# Patient Record
Sex: Female | Born: 1970 | Race: White | Hispanic: Yes | Marital: Married
Health system: Southern US, Community
[De-identification: ages and names within clinical notes are randomized; demographics above are authoritative.]

## PROBLEM LIST (undated history)

## (undated) DIAGNOSIS — R7303 Prediabetes: Secondary | ICD-10-CM

## (undated) HISTORY — PX: HAND SURGERY: SHX662

---

## 2006-10-24 ENCOUNTER — Ambulatory Visit: Payer: Self-pay | Admitting: Family Medicine

## 2007-04-23 ENCOUNTER — Inpatient Hospital Stay: Payer: Self-pay

## 2019-01-12 ENCOUNTER — Other Ambulatory Visit: Payer: Self-pay

## 2019-01-12 DIAGNOSIS — Z20822 Contact with and (suspected) exposure to covid-19: Secondary | ICD-10-CM

## 2019-01-15 LAB — NOVEL CORONAVIRUS, NAA: SARS-CoV-2, NAA: NOT DETECTED

## 2019-07-26 ENCOUNTER — Ambulatory Visit: Payer: Self-pay | Attending: Internal Medicine

## 2019-07-26 DIAGNOSIS — Z23 Encounter for immunization: Secondary | ICD-10-CM | POA: Insufficient documentation

## 2019-07-26 NOTE — Progress Notes (Signed)
   Covid-19 Vaccination Clinic  Name:  Amy Arroyo    MRN: [id number] DOB: Jan 04, 1971  07/26/2019  Ms. Amy Arroyo was observed post Covid-19 immunization for 15 minutes without incidence. She was provided with Vaccine Information Sheet and instruction to access the V-Safe system.   Ms. Amy Arroyo was instructed to call 911 with any severe reactions post vaccine: Marland Kitchen Difficulty breathing  . Swelling of your face and throat  . A fast heartbeat  . A bad rash all over your body  . Dizziness and weakness    Immunizations Administered    Name Date Dose VIS Date Route   Pfizer COVID-19 Vaccine 07/26/2019 10:22 AM 0.3 mL 05/29/2019 Intramuscular   Manufacturer: [employer], [employer]   Lot: [id number]   NDC: [address]-8

## 2019-08-19 ENCOUNTER — Ambulatory Visit: Payer: Self-pay | Attending: Internal Medicine

## 2019-08-19 DIAGNOSIS — Z23 Encounter for immunization: Secondary | ICD-10-CM | POA: Insufficient documentation

## 2019-08-19 NOTE — Progress Notes (Signed)
   Covid-19 Vaccination Clinic  Name:  Amy Arroyo    MRN: [id number] DOB: 1971/02/05  08/19/2019  Amy Arroyo was observed post Covid-19 immunization for 15 minutes without incident. She was provided with Vaccine Information Sheet and instruction to access the V-Safe system.   Amy Arroyo was instructed to call 911 with any severe reactions post vaccine: Marland Kitchen Difficulty breathing  . Swelling of face and throat  . A fast heartbeat  . A bad rash all over body  . Dizziness and weakness   Immunizations Administered    Name Date Dose VIS Date Route   Pfizer COVID-19 Vaccine 08/19/2019  8:28 AM 0.3 mL 05/29/2019 Intramuscular   Manufacturer: [employer], [employer]   Lot: [id number]   NDC: [address]-1

## 2019-12-07 ENCOUNTER — Ambulatory Visit
Admission: RE | Admit: 2019-12-07 | Discharge: 2019-12-07 | Disposition: A | Discharge status: Home / Self Care | Payer: PRIVATE HEALTH INSURANCE | Source: Ambulatory Visit | Attending: Family Medicine | Admitting: Family Medicine

## 2019-12-07 ENCOUNTER — Other Ambulatory Visit: Payer: Self-pay | Admitting: Family Medicine

## 2019-12-07 DIAGNOSIS — M25571 Pain in right ankle and joints of right foot: Secondary | ICD-10-CM

## 2019-12-07 DIAGNOSIS — M25471 Effusion, right ankle: Secondary | ICD-10-CM | POA: Diagnosis present

## 2020-05-02 ENCOUNTER — Other Ambulatory Visit: Payer: Self-pay | Admitting: Family Medicine

## 2020-05-02 DIAGNOSIS — Z1231 Encounter for screening mammogram for malignant neoplasm of breast: Secondary | ICD-10-CM

## 2020-07-28 IMAGING — CR DG ANKLE 2V *R*
2 series · 2 of 2 positions shown · non-contrast
Comparison: None.

CLINICAL DATA: Right pain and swelling for 2 weeks, initial
encounter

EXAM:
RIGHT ANKLE - 2 VIEW

[ankle ap]
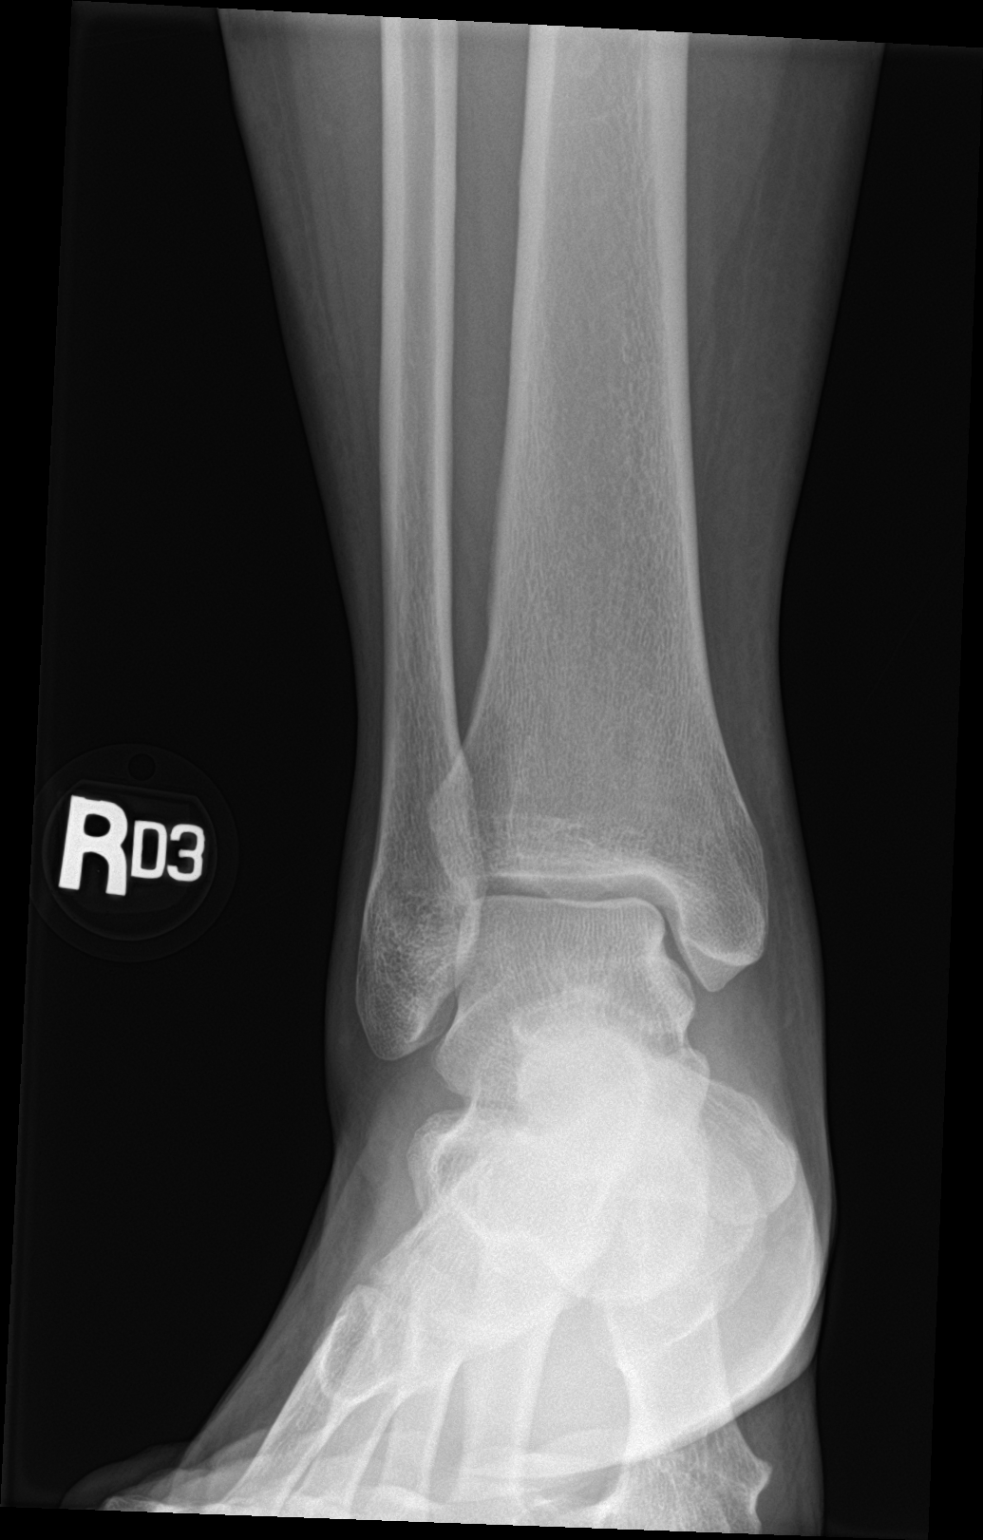

[ankle lat]
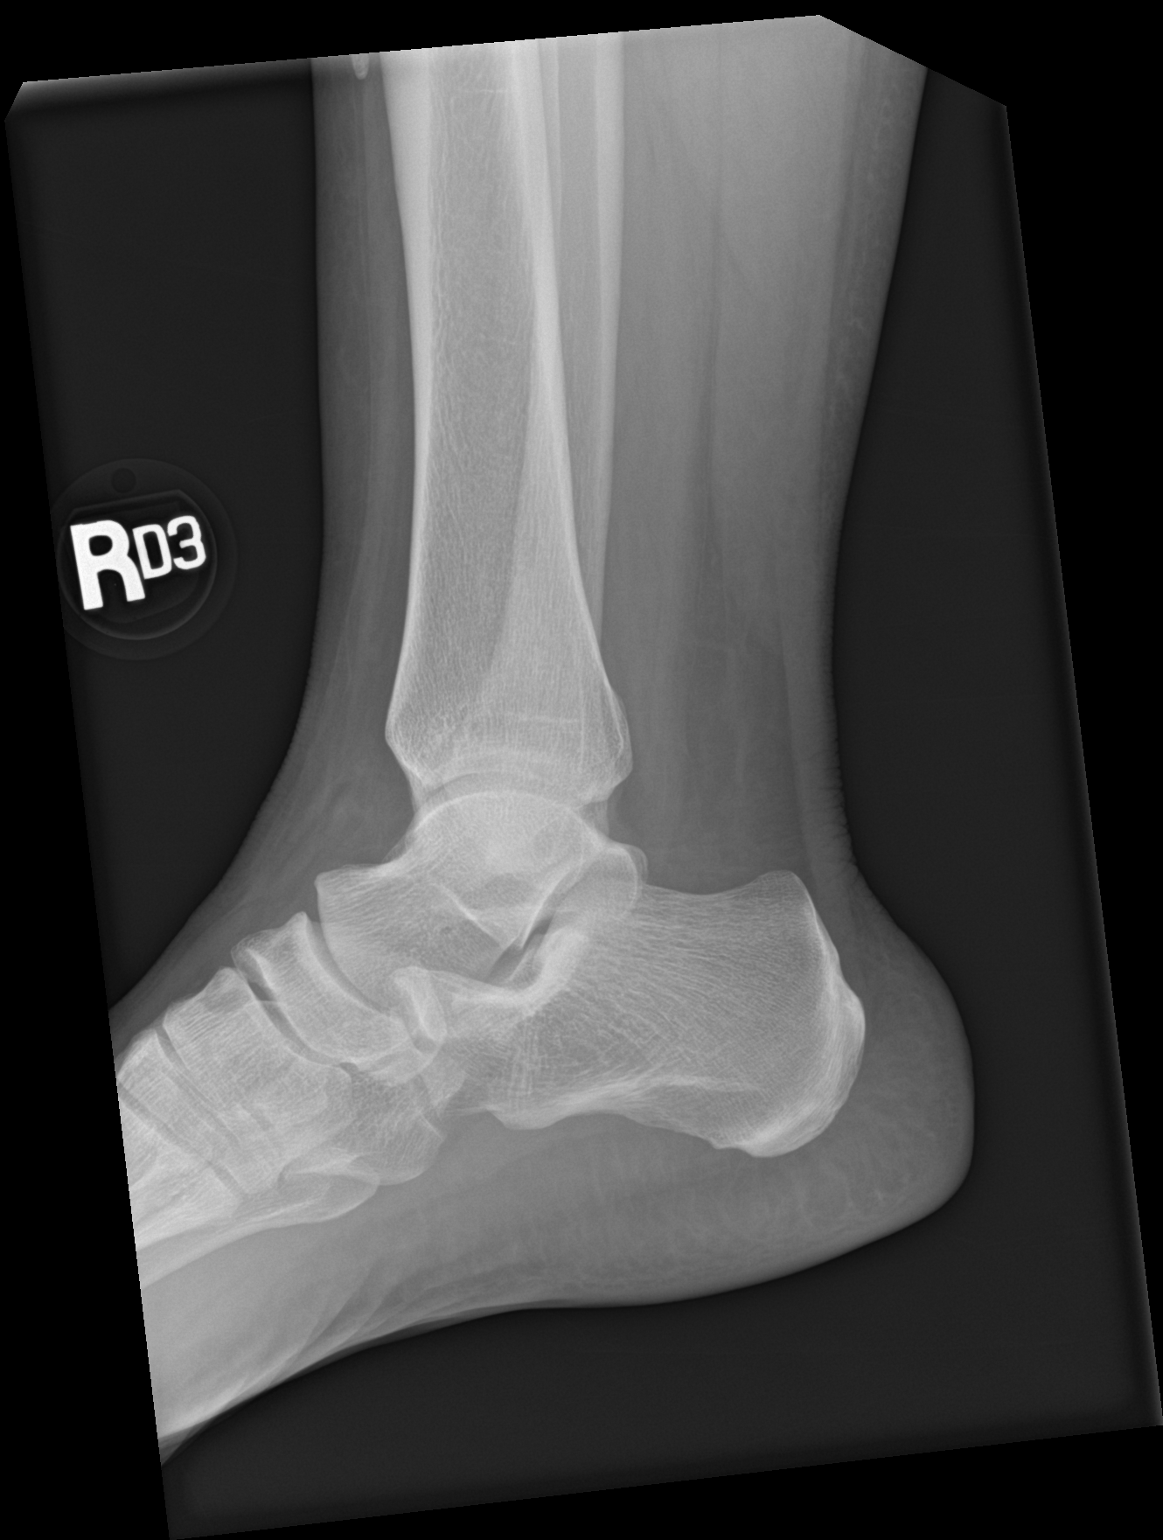

[2 of 2 positions shown; findings below may reference images not displayed]

FINDINGS: There is no evidence of fracture, dislocation, or joint effusion.
There is no evidence of arthropathy or other focal bone abnormality.
Soft tissues are unremarkable.
IMPRESSION: No acute abnormality noted.

## 2022-04-30 ENCOUNTER — Other Ambulatory Visit: Payer: Self-pay | Admitting: Family Medicine

## 2022-04-30 DIAGNOSIS — Z1231 Encounter for screening mammogram for malignant neoplasm of breast: Secondary | ICD-10-CM

## 2022-05-02 ENCOUNTER — Encounter: Payer: Self-pay | Admitting: Family Medicine

## 2022-05-16 ENCOUNTER — Other Ambulatory Visit: Payer: Self-pay

## 2022-05-16 ENCOUNTER — Ambulatory Visit (INDEPENDENT_AMBULATORY_CARE_PROVIDER_SITE_OTHER): Payer: BC Managed Care – PPO | Admitting: Dermatology

## 2022-05-16 DIAGNOSIS — R21 Rash and other nonspecific skin eruption: Secondary | ICD-10-CM

## 2022-05-16 MED ORDER — LEVOCETIRIZINE DIHYDROCHLORIDE 5 MG PO TABS: 5.0000 mg | ORAL_TABLET | Freq: Every evening | ORAL | 5 refills | Status: AC

## 2022-05-16 NOTE — Patient Instructions (Addendum)
Urticaria or hives is a pink to red patchy whelp- like rash of the skin that typically itches and it is the result of histamine release in the skin.   Hives may have multiple causes including stress, medications, infections, and systemic illness.  Sometimes there is a family history of chronic urticaria.   "Physical urticarias" may be caused by heat, sun, cold, vibration.   Insect bites can cause "papular urticaria". It is often difficult to find the cause of generalized hives.  Statistically, 70% of the time a cause of generalized hives is not found.  Sometimes hives can spontaneously resolve. Other times hives can persist and when it does, and no cause is found, and it has been at least 6 weeks since started, it is called "chronic idiopathic urticaria". Antihistamines are the mainstay for treatment.  In severe cases Xolair injections may be used.  Start Xyzal take 1 tablet every morning. Start Benadryl take 1 tablet every evening.  May use triamcinolone 0.1% ointment once to twice daily as needed for itch. Avoid face, groin, underarms.  Topical steroids (such as triamcinolone, fluocinolone, fluocinonide, mometasone, clobetasol, halobetasol, betamethasone, hydrocortisone) can cause thinning and lightening of the skin if they are used for too long in the same area. Your physician has selected the right strength medicine for your problem and area affected on the body. Please use your medication only as directed by your physician to prevent side effects.   Gentle Skin Care Guide  1. Bathe no more than once a day.  2. Avoid bathing in hot water  3. Use a mild soap like Dove, Vanicream, Cetaphil, CeraVe. Can use Lever 2000 or Cetaphil antibacterial soap  4. Use soap only where you need it. On most days, use it under your arms, between your legs, and on your feet. Let the water rinse other areas unless visibly dirty.  5. When you get out of the bath/shower, use a towel to gently blot your skin dry,  don't rub it.  6. While your skin is still a little damp, apply a moisturizing cream such as Vanicream, CeraVe, Cetaphil, Eucerin, Sarna lotion or plain Vaseline Jelly. For hands apply Neutrogena [place] Hand Cream or Excipial Hand Cream.  7. Reapply moisturizer any time you start to itch or feel dry.  8. Sometimes using free and clear laundry detergents can be helpful. Fabric softener sheets should be avoided. Downy Free & Gentle liquid, or any liquid fabric softener that is free of dyes and perfumes, it acceptable to use  9. If your doctor has given you prescription creams you may apply moisturizers over them     Due to recent changes in healthcare laws, you may see results of your pathology and/or laboratory studies on MyChart before the doctors have had a chance to review them. We understand that in some cases there may be results that are confusing or concerning to you. Please understand that not all results are received at the same time and often the doctors may need to interpret multiple results in order to provide you with the best plan of care or course of treatment. Therefore, we ask that you please give [place] 2 business days to thoroughly review all your results before contacting the office for clarification. Should we see a critical lab result, you will be contacted sooner.   If You Need Anything After Your Visit  If you have any questions or concerns for your doctor, please call our main line at [phone] and press option 4 to  reach your doctor's medical assistant. If no one answers, please leave a voicemail as directed and we will return your call as soon as possible. Messages left after 4 pm will be answered the following business day.   You may also send [place] a message via MyChart. We typically respond to MyChart messages within 1-2 business days.  For prescription refills, please ask your pharmacy to contact our office. Our fax number is [phone].  If you have an urgent  issue when the clinic is closed that cannot wait until the next business day, you can page your doctor at the number below.    Please note that while we do our best to be available for urgent issues outside of office hours, we are not available 24/7.   If you have an urgent issue and are unable to reach [place], you may choose to seek medical care at your doctor's office, retail clinic, urgent care center, or emergency room.  If you have a medical emergency, please immediately call 911 or go to the emergency department.  Pager Numbers  - Dr. Gwen Pounds: [phone]  - Dr. Neale Burly: [phone]  - Dr. Roseanne Reno: [phone]  In the event of inclement weather, please call our main line at [phone] for an update on the status of any delays or closures.  Dermatology Medication Tips: Please keep the boxes that topical medications come in in order to help keep track of the instructions about where and how to use these. Pharmacies typically print the medication instructions only on the boxes and not directly on the medication tubes.   If your medication is too expensive, please contact our office at [phone] option 4 or send [place] a message through MyChart.   We are unable to tell what your co-pay for medications will be in advance as this is different depending on your insurance coverage. However, we may be able to find a substitute medication at lower cost or fill out paperwork to get insurance to cover a needed medication.   If a prior authorization is required to get your medication covered by your insurance company, please allow [place] 1-2 business days to complete this process.  Drug prices often vary depending on where the prescription is filled and some pharmacies may offer cheaper prices.  The website www.goodrx.com contains coupons for medications through different pharmacies. The prices here do not account for what the cost may be with help from insurance (it may be cheaper with your  insurance), but the website can give you the price if you did not use any insurance.  - You can print the associated coupon and take it with your prescription to the pharmacy.  - You may also stop by our office during regular business hours and pick up a GoodRx coupon card.  - If you need your prescription sent electronically to a different pharmacy, notify our office through [facility] or by phone at [phone] option 4.     Si Usted Necesita Algo Despus de Su Visita  Tambin puede enviarnos un mensaje a travs de Clinical cytogeneticist. Por lo general respondemos a los mensajes de MyChart en el transcurso de 1 a 2 das hbiles.  Para renovar recetas, por favor pida a su farmacia que se ponga en contacto con nuestra oficina. Annie Sable de fax es [place] [phone].  Si tiene un asunto urgente cuando la clnica est cerrada y que no puede esperar hasta el siguiente da hbil, puede llamar/localizar a su doctor(a) al nmero que aparece a continuacin.  Por favor, tenga en cuenta que aunque hacemos todo lo posible para estar disponibles para asuntos urgentes fuera del horario de [place], no estamos disponibles las 24 horas del da, los 7 [address]  [address] de la [place].   Si tiene un problema urgente y no puede comunicarse con nosotros, puede optar por buscar atencin mdica  en el consultorio de su doctor(a), en una clnica privada, en un centro de atencin urgente o en una sala de emergencias.  Si tiene Engineer, drilling, por favor llame inmediatamente al 911 o vaya a la sala de emergencias.  Nmeros de bper  - Dr. Gwen Pounds: [phone]  - Dra. Moye: [phone]  - Dra. Roseanne Reno: [phone]  En caso de inclemencias del [place], por favor llame a Lacy Duverney principal al [phone] para una actualizacin sobre el [place] de cualquier retraso o cierre.  Consejos para la medicacin en dermatologa: Por favor, guarde las cajas en las que vienen los medicamentos de uso tpico para ayudarle a  seguir las instrucciones sobre dnde y cmo usarlos. Las farmacias generalmente imprimen las instrucciones del medicamento slo en las cajas y no directamente en los tubos del [place].   Si su medicamento es muy caro, por favor, pngase en contacto con Rolm Gala llamando al [phone] y presione la opcin 4 o envenos un mensaje a travs de Clinical cytogeneticist.   No podemos decirle cul ser su copago por los medicamentos por adelantado ya que esto es diferente dependiendo de la cobertura de su seguro. Sin embargo, es posible que podamos encontrar un medicamento sustituto a Audiological scientist un formulario para que el seguro cubra el medicamento que se considera necesario.   Si se requiere una autorizacin previa para que su compaa de seguros [place] su medicamento, por favor permtanos de 1 a 2 das hbiles para completar [address]  Los precios de los medicamentos varan con frecuencia dependiendo del Environmental consultant de dnde se surte la receta y alguna farmacias pueden ofrecer precios ms baratos.  El sitio web www.goodrx.com tiene cupones para medicamentos de Health and safety inspector. Los precios aqu no tienen en cuenta lo que podra costar con la ayuda del seguro (puede ser ms barato con su seguro), pero el sitio web puede darle el precio si no utiliz Tourist information centre manager.  - Puede imprimir el cupn correspondiente y llevarlo con su receta a la farmacia.  - Tambin puede pasar por nuestra oficina durante el horario de atencin regular y Education officer, museum una tarjeta de cupones de GoodRx.  - Si necesita que su receta se enve electrnicamente a una farmacia diferente, informe a nuestra oficina a travs de MyChart de [place] o por telfono llamando al [phone] y presione la opcin 4.

## 2022-05-16 NOTE — Progress Notes (Signed)
   New Patient Visit  Subjective  Amy Arroyo is a 51 y.o. female who presents for the following: Rash.  Chest and neck. Rash has flared several times this year, only when it's warmer weather and when she is outside. Rash is itchy and burns and takes about 2-3 weeks to clear. Gets worse when scratched. She has seen her regular doctor and is prescribed Prednisone, which helps. She has also tried TMC 0.1% ointment and Ammonium Lactate 12%. She takes Zyrtec for itching. She does have seasonal allergies.  No history of asthma. Patient had a reaction when she ate pizza recently, rash on face. Rash lasted for about 30 minutes. It happened twice after eating pizza. No mouth/throat swelling. No recent illness, no fevers, no thyroid problems. Patient otherwise healthy.  The following portions of the chart were reviewed this encounter and updated as appropriate:       Review of Systems:  No other skin or systemic complaints except as noted in HPI or Assessment and Plan.  Objective  Well appearing patient in no apparent distress; mood and affect are within normal limits.  A focused examination was performed including face, chest, neck, arms, back. Relevant physical exam findings are noted in the Assessment and Plan.  chest Mild erythema on the chest today. Photo patient has on phone shows edematous pink papules and plaques on the neck, chest. Positive dermatographism today.    Assessment & Plan  Rash chest  Possible Urticaria by history and photo, rash not present today. History of facial rash after eating pizza, no throat swelling.  When rash recurs, start Xyzal 5 MG take 1 po QAM dsp #30  And start Benadryl 1 po QHS May continue TMC 0.1% ointment QD/BID prn itch. Avoid face, groin, axilla. Pt has.  Topical steroids (such as triamcinolone, fluocinolone, fluocinonide, mometasone, clobetasol, halobetasol, betamethasone, hydrocortisone) can cause thinning and lightening of the skin if  they are used for too long in the same area. Your physician has selected the right strength medicine for your problem and area affected on the body. Please use your medication only as directed by your physician to prevent side effects.   Will refer patient to allergist for possible food allergy.  Allergy.   Pt to RTC for evaluation when rash present  Recommend mild soap and moisturizing cream 1-2 times daily.  Gentle skin care handout provided.    levocetirizine (XYZAL) 5 MG tablet - chest Take 1 tablet (5 mg total) by mouth every evening.   Return if symptoms worsen or fail to improve.  ICherlyn Labella, CMA, am acting as scribe for Willeen Niece, MD .  Documentation: I have reviewed the above documentation for accuracy and completeness, and I agree with the above.  Willeen Niece MD

## 2022-05-17 ENCOUNTER — Encounter: Payer: Self-pay | Admitting: Dermatology

## 2022-05-17 ENCOUNTER — Ambulatory Visit (INDEPENDENT_AMBULATORY_CARE_PROVIDER_SITE_OTHER): Payer: BC Managed Care – PPO | Admitting: Dermatology

## 2022-05-17 DIAGNOSIS — R21 Rash and other nonspecific skin eruption: Secondary | ICD-10-CM

## 2022-05-17 MED ORDER — DOXYCYCLINE MONOHYDRATE 100 MG PO CAPS: 100.0000 mg | ORAL_CAPSULE | Freq: Two times a day (BID) | ORAL | 0 refills | Status: AC

## 2022-05-17 MED ORDER — MUPIROCIN 2 % EX OINT: 1.0000 | TOPICAL_OINTMENT | Freq: Every day | CUTANEOUS | 0 refills | Status: AC

## 2022-05-17 NOTE — Patient Instructions (Signed)
Start mupirocin ointment to affect areas and cover with band aid. Start doxycycline 100 mg twice daily with food x 7.  Continue Xyzal increasing to 1 pill twice daily. May increase to 2 pills twice daily if needed.   Doxycycline should be taken with food to prevent nausea. Do not lay down for 30 minutes after taking. Be cautious with sun exposure and use good sun protection while on this medication. Pregnant women should not take this medication.   Due to recent changes in healthcare laws, you may see results of your pathology and/or laboratory studies on MyChart before the doctors have had a chance to review them. We understand that in some cases there may be results that are confusing or concerning to you. Please understand that not all results are received at the same time and often the doctors may need to interpret multiple results in order to provide you with the best plan of care or course of treatment. Therefore, we ask that you please give [place] 2 business days to thoroughly review all your results before contacting the office for clarification. Should we see a critical lab result, you will be contacted sooner.   If You Need Anything After Your Visit  If you have any questions or concerns for your doctor, please call our main line at [phone] and press option 4 to reach your doctor's medical assistant. If no one answers, please leave a voicemail as directed and we will return your call as soon as possible. Messages left after 4 pm will be answered the following business day.   You may also send [place] a message via MyChart. We typically respond to MyChart messages within 1-2 business days.  For prescription refills, please ask your pharmacy to contact our office. Our fax number is [phone].  If you have an urgent issue when the clinic is closed that cannot wait until the next business day, you can page your doctor at the number below.    Please note that while we do our best to be  available for urgent issues outside of office hours, we are not available 24/7.   If you have an urgent issue and are unable to reach [place], you may choose to seek medical care at your doctor's office, retail clinic, urgent care center, or emergency room.  If you have a medical emergency, please immediately call 911 or go to the emergency department.  Pager Numbers  - Dr. Gwen Pounds: [phone]  - Dr. Neale Burly: [phone]  - Dr. Roseanne Reno: [phone]  In the event of inclement weather, please call our main line at [phone] for an update on the status of any delays or closures.  Dermatology Medication Tips: Please keep the boxes that topical medications come in in order to help keep track of the instructions about where and how to use these. Pharmacies typically print the medication instructions only on the boxes and not directly on the medication tubes.   If your medication is too expensive, please contact our office at [phone] option 4 or send [place] a message through MyChart.   We are unable to tell what your co-pay for medications will be in advance as this is different depending on your insurance coverage. However, we may be able to find a substitute medication at lower cost or fill out paperwork to get insurance to cover a needed medication.   If a prior authorization is required to get your medication covered by your insurance company, please allow [place] 1-2 business days to complete  this process.  Drug prices often vary depending on where the prescription is filled and some pharmacies may offer cheaper prices.  The website www.goodrx.com contains coupons for medications through different pharmacies. The prices here do not account for what the cost may be with help from insurance (it may be cheaper with your insurance), but the website can give you the price if you did not use any insurance.  - You can print the associated coupon and take it with your prescription to the pharmacy.  -  You may also stop by our office during regular business hours and pick up a GoodRx coupon card.  - If you need your prescription sent electronically to a different pharmacy, notify our office through [facility] or by phone at [phone] option 4.     Si Usted Necesita Algo Despus de Su Visita  Tambin puede enviarnos un mensaje a travs de Clinical cytogeneticist. Por lo general respondemos a los mensajes de MyChart en el transcurso de 1 a 2 das hbiles.  Para renovar recetas, por favor pida a su farmacia que se ponga en contacto con nuestra oficina. Annie Sable de fax es [place] [phone].  Si tiene un asunto urgente cuando la clnica est cerrada y que no puede esperar hasta el siguiente da hbil, puede llamar/localizar a su doctor(a) al nmero que aparece a continuacin.   Por favor, tenga en cuenta que aunque hacemos todo lo posible para estar disponibles para asuntos urgentes fuera del horario de [place], no estamos disponibles las 24 horas del da, los 7 [address]  [address] de la [place].   Si tiene un problema urgente y no puede comunicarse con nosotros, puede optar por buscar atencin mdica  en el consultorio de su doctor(a), en una clnica privada, en un centro de atencin urgente o en una sala de emergencias.  Si tiene Engineer, drilling, por favor llame inmediatamente al 911 o vaya a la sala de emergencias.  Nmeros de bper  - Dr. Gwen Pounds: [phone]  - Dra. Moye: [phone]  - Dra. Roseanne Reno: [phone]  En caso de inclemencias del [place], por favor llame a Lacy Duverney principal al [phone] para una actualizacin sobre el [place] de cualquier retraso o cierre.  Consejos para la medicacin en dermatologa: Por favor, guarde las cajas en las que vienen los medicamentos de uso tpico para ayudarle a seguir las instrucciones sobre dnde y cmo usarlos. Las farmacias generalmente imprimen las instrucciones del medicamento slo en las cajas y no directamente en los tubos del  [place].   Si su medicamento es muy caro, por favor, pngase en contacto con Rolm Gala llamando al [phone] y presione la opcin 4 o envenos un mensaje a travs de Clinical cytogeneticist.   No podemos decirle cul ser su copago por los medicamentos por adelantado ya que esto es diferente dependiendo de la cobertura de su seguro. Sin embargo, es posible que podamos encontrar un medicamento sustituto a Audiological scientist un formulario para que el seguro cubra el medicamento que se considera necesario.   Si se requiere una autorizacin previa para que su compaa de seguros [place] su medicamento, por favor permtanos de 1 a 2 das hbiles para completar [address]  Los precios de los medicamentos varan con frecuencia dependiendo del Environmental consultant de dnde se surte la receta y alguna farmacias pueden ofrecer precios ms baratos.  El sitio web www.goodrx.com tiene cupones para medicamentos de Health and safety inspector. Los precios aqu no tienen en cuenta lo que podra costar con la ayuda del seguro (  puede ser ms barato con su seguro), pero el sitio web puede darle el precio si no utiliz ningn seguro.  - Puede imprimir el cupn correspondiente y llevarlo con su receta a la farmacia.  - Tambin puede pasar por nuestra oficina durante el horario de atencin regular y recoger una tarjeta de cupones de GoodRx.  - Si necesita que su receta se enve electrnicamente a una farmacia diferente, informe a nuestra oficina a travs de MyChart de [place] o por telfono llamando al [phone] y presione la opcin 4.  

## 2022-05-17 NOTE — Progress Notes (Signed)
   Follow-Up Visit   Subjective  Amy Arroyo is a 51 y.o. female who presents for the following: Rash (Patient was seen yesterday for history of a rash at chest. Patient was told to take Xyzal, Benadryl and use TMC 0.1% ointment when rash recurs. She is back here today for a few spots that are now itching and weeping that started this am. She notes these are different than any rashes she has had before).  Patient advises the spots burn and itch.   The following portions of the chart were reviewed this encounter and updated as appropriate:   Allergies  Meds  Problems  Med Hx  Surg Hx  Fam Hx      Review of Systems:  No other skin or systemic complaints except as noted in HPI or Assessment and Plan.  Objective  Well appearing patient in no apparent distress; mood and affect are within normal limits.  A focused examination was performed including arms, legs, chest. Relevant physical exam findings are noted in the Assessment and Plan.  Right Upper Arm Tender edematous erythematous plaques approximately 6-7 cm wide with central erosion and weeping at left ankle, 2 at right leg and 1 at upper arm    Assessment & Plan  Rash Right Upper Arm  Pt reports this is different than rashes she has had previous. She reports burning at sites. The areas appeared overnight.   Favor bacterial infection based on exam and history.  Start mupirocin ointment to affect areas and cover with band aid. Start doxycycline 100 mg twice daily with food x 7.  Continue Xyzal increasing to 1 pill twice daily. May increase to 2 pills twice daily if needed for itch.  Pt advised to seek medical care for fevers, chills, spreading redness. Pt advised to reach out if this is not better by early next week.  Doxycycline should be taken with food to prevent nausea. Do not lay down for 30 minutes after taking. Be cautious with sun exposure and use good sun protection while on this medication. Pregnant women  should not take this medication.     doxycycline (MONODOX) 100 MG capsule - Right Upper Arm Take 1 capsule (100 mg total) by mouth 2 (two) times daily for 7 days. Take with food  mupirocin ointment (BACTROBAN) 2 % - Right Upper Arm Apply 1 Application topically daily. And cover with band aid  Related Procedures Anaerobic and Aerobic Culture  Related Medications levocetirizine (XYZAL) 5 MG tablet Take 1 tablet (5 mg total) by mouth every evening.   Return if symptoms worsen or fail to improve.  Anise Salvo, RMA, am acting as scribe for Darden Dates, MD .  Documentation: I have reviewed the above documentation for accuracy and completeness, and I agree with the above.  Darden Dates, MD

## 2022-05-23 ENCOUNTER — Ambulatory Visit (INDEPENDENT_AMBULATORY_CARE_PROVIDER_SITE_OTHER): Payer: BC Managed Care – PPO | Admitting: Dermatology

## 2022-05-23 ENCOUNTER — Encounter: Payer: Self-pay | Admitting: Dermatology

## 2022-05-23 DIAGNOSIS — R21 Rash and other nonspecific skin eruption: Secondary | ICD-10-CM

## 2022-05-23 MED ORDER — CLOBETASOL PROPIONATE 0.05 % EX OINT: TOPICAL_OINTMENT | CUTANEOUS | 1 refills | Status: AC

## 2022-05-23 NOTE — Patient Instructions (Addendum)
For Rash at legs  Start clobetasol ointment twice daily to affected areas of rash use for up to 2 weeks. Do not apply to open areas. Avoid face/groin/ body folds  Use mupirocin ointment apply to any open areas and cover until healed.   Discontinue doxycycline     Topical steroids (such as triamcinolone, fluocinolone, fluocinonide, mometasone, clobetasol, halobetasol, betamethasone, hydrocortisone) can cause thinning and lightening of the skin if they are used for too long in the same area. Your physician has selected the right strength medicine for your problem and area affected on the body. Please use your medication only as directed by your physician to prevent side effects.    Due to recent changes in healthcare laws, you may see results of your pathology and/or laboratory studies on MyChart before the doctors have had a chance to review them. We understand that in some cases there may be results that are confusing or concerning to you. Please understand that not all results are received at the same time and often the doctors may need to interpret multiple results in order to provide you with the best plan of care or course of treatment. Therefore, we ask that you please give [place] 2 business days to thoroughly review all your results before contacting the office for clarification. Should we see a critical lab result, you will be contacted sooner.   If You Need Anything After Your Visit  If you have any questions or concerns for your doctor, please call our main line at [phone] and press option 4 to reach your doctor's medical assistant. If no one answers, please leave a voicemail as directed and we will return your call as soon as possible. Messages left after 4 pm will be answered the following business day.   You may also send [place] a message via MyChart. We typically respond to MyChart messages within 1-2 business days.  For prescription refills, please ask your pharmacy to contact our  office. Our fax number is [phone].  If you have an urgent issue when the clinic is closed that cannot wait until the next business day, you can page your doctor at the number below.    Please note that while we do our best to be available for urgent issues outside of office hours, we are not available 24/7.   If you have an urgent issue and are unable to reach [place], you may choose to seek medical care at your doctor's office, retail clinic, urgent care center, or emergency room.  If you have a medical emergency, please immediately call 911 or go to the emergency department.  Pager Numbers  - Dr. Gwen Pounds: [phone]  - Dr. Neale Burly: [phone]  - Dr. Roseanne Reno: [phone]  In the event of inclement weather, please call our main line at [phone] for an update on the status of any delays or closures.  Dermatology Medication Tips: Please keep the boxes that topical medications come in in order to help keep track of the instructions about where and how to use these. Pharmacies typically print the medication instructions only on the boxes and not directly on the medication tubes.   If your medication is too expensive, please contact our office at [phone] option 4 or send [place] a message through MyChart.   We are unable to tell what your co-pay for medications will be in advance as this is different depending on your insurance coverage. However, we may be able to find a substitute medication at lower cost or fill  out paperwork to get insurance to cover a needed medication.   If a prior authorization is required to get your medication covered by your insurance company, please allow [place] 1-2 business days to complete this process.  Drug prices often vary depending on where the prescription is filled and some pharmacies may offer cheaper prices.  The website www.goodrx.com contains coupons for medications through different pharmacies. The prices here do not account for what the cost may  be with help from insurance (it may be cheaper with your insurance), but the website can give you the price if you did not use any insurance.  - You can print the associated coupon and take it with your prescription to the pharmacy.  - You may also stop by our office during regular business hours and pick up a GoodRx coupon card.  - If you need your prescription sent electronically to a different pharmacy, notify our office through [facility] or by phone at [phone] option 4.     Si Usted Necesita Algo Despus de Su Visita  Tambin puede enviarnos un mensaje a travs de Pharmacist, community. Por lo general respondemos a los mensajes de MyChart en el transcurso de 1 a 2 das hbiles.  Para renovar recetas, por favor pida a su farmacia que se ponga en contacto con nuestra oficina. Harland Dingwall de fax es [place] [phone].  Si tiene un asunto urgente cuando la clnica est cerrada y que no puede esperar hasta el siguiente da hbil, puede llamar/localizar a su doctor(a) al nmero que aparece a continuacin.   Por favor, tenga en cuenta que aunque hacemos todo lo posible para estar disponibles para asuntos urgentes fuera del horario de [place], no estamos disponibles las 24 horas del da, los 7 das de la [place].   Si tiene un problema urgente y no puede comunicarse con nosotros, puede optar por buscar atencin mdica  en el consultorio de su doctor(a), en una clnica privada, en un centro de atencin urgente o en una sala de emergencias.  Si tiene Engineering geologist, por favor llame inmediatamente al 911 o vaya a la sala de emergencias.  Nmeros de bper  - Dr. Nehemiah Massed: [phone]  - Dra. Moye: [phone]  - Dra. Nicole Kindred: [phone]  En caso de inclemencias del [place], por favor llame a Johnsie Kindred principal al [phone] para una actualizacin sobre el [place] de cualquier retraso o cierre.  Consejos para la medicacin en dermatologa: Por favor, guarde las cajas en las  que vienen los medicamentos de uso tpico para ayudarle a seguir las instrucciones sobre dnde y cmo usarlos. Las farmacias generalmente imprimen las instrucciones del medicamento slo en las cajas y no directamente en los tubos del [place].   Si su medicamento es muy caro, por favor, pngase en contacto con Zigmund Daniel llamando al [phone] y presione la opcin 4 o envenos un mensaje a travs de Pharmacist, community.   No podemos decirle cul ser su copago por los medicamentos por adelantado ya que esto es diferente dependiendo de la cobertura de su seguro. Sin embargo, es posible que podamos encontrar un medicamento sustituto a Electrical engineer un formulario para que el seguro cubra el medicamento que se considera necesario.   Si se requiere una autorizacin previa para que su compaa de seguros [place] su medicamento, por favor permtanos de 1 a 2 das hbiles para completar este proceso.  Los precios de los medicamentos varan con frecuencia dependiendo del Environmental consultant de dnde se surte la receta y  alguna farmacias pueden ofrecer precios ms baratos.  El sitio web www.goodrx.com tiene cupones para medicamentos de Airline pilot. Los precios aqu no tienen en cuenta lo que podra costar con la ayuda del seguro (puede ser ms barato con su seguro), pero el sitio web puede darle el precio si no utiliz Research scientist (physical sciences).  - Puede imprimir el cupn correspondiente y llevarlo con su receta a la farmacia.  - Tambin puede pasar por nuestra oficina durante el horario de atencin regular y Charity fundraiser una tarjeta de cupones de GoodRx.  - Si necesita que su receta se enve electrnicamente a una farmacia diferente, informe a nuestra oficina a travs de MyChart de [place] o por telfono llamando al [phone] y presione la opcin 4.

## 2022-05-23 NOTE — Progress Notes (Signed)
   Follow-Up Visit   Subjective  Amy Arroyo is a 51 y.o. female who presents for the following: Rash (Patient here today with itchy rash follow up. Reports rash has spread b/l lower legs. Patient was prescribed doxycycline, mupirocin and xyzal to use. Reports itching a lot and can not sleep. ).  Clobetasol ointment bid aa skip open areas for 2 weeks.   The following portions of the chart were reviewed this encounter and updated as appropriate:  Allergies  Meds  Problems  Med Hx  Surg Hx  Fam Hx      Review of Systems: No other skin or systemic complaints except as noted in HPI or Assessment and Plan.   Objective  Well appearing patient in no apparent distress; mood and affect are within normal limits.  A focused examination was performed including legs, arms. Relevant physical exam findings are noted in the Assessment and Plan.  b/l lower legs Edematous small erythematous papule centrally located within erythematous thin circular plaque at right leg. Also at right and left legs are central small erosions within erythematous patches   Assessment & Plan  Rash and other nonspecific skin eruption b/l lower legs  Exam very suggestive of bite reaction today and not suspicious for infection based on additional lesions and evolution of lesions from last week.   Start clobetasol ointment bid to aa areas of rash use for up to 2 weeks. Do not apply to open areas. Avoid face/groin/ body folds  Continue to use mupirocin ointment to any open areas apply daily  Recommend avoiding exposure to insects, using insect repellent. Call if not clearing up.  Topical steroids (such as triamcinolone, fluocinolone, fluocinonide, mometasone, clobetasol, halobetasol, betamethasone, hydrocortisone) can cause thinning and lightening of the skin if they are used for too long in the same area. Your physician has selected the right strength medicine for your problem and area affected on the body.  Please use your medication only as directed by your physician to prevent side effects.    clobetasol ointment (TEMOVATE) 0.05 % - b/l lower legs Apply bid to aa of rash for up to 2 weeks. Do not apply to open areas. Avoid applying to face, groin, and axilla. Use as directed. Long-term use can cause thinning of the skin.   Return for 1-3 month rash follow up.  I, Asher Muir, CMA, am acting as scribe for Darden Dates, MD.  Documentation: I have reviewed the above documentation for accuracy and completeness, and I agree with the above.  Darden Dates, MD

## 2022-05-25 LAB — ANAEROBIC AND AEROBIC CULTURE

## 2022-05-28 ENCOUNTER — Telehealth: Payer: Self-pay

## 2022-05-28 NOTE — Telephone Encounter (Signed)
-----   Message from Virginia Moye, MD sent at 05/28/2022 11:32 AM EST ----- Culture showed bacteria that usually causes acne. As long as she is doing well, no additional antibiotic treatment needed. Please also check how she is doing with the clobetasol prescribed last week. Thank you!  MAs please call. Thank you! 

## 2022-05-29 ENCOUNTER — Telehealth: Payer: Self-pay

## 2022-05-29 NOTE — Telephone Encounter (Signed)
Patient advised culture showed bacteria that normally causes acne, no further treatment needed. Patient advises she is improved since using clobetasol. Butch Penny., RMA

## 2022-05-29 NOTE — Telephone Encounter (Signed)
-----   Message from Sandi Mealy, MD sent at 05/28/2022 11:32 AM EST ----- Culture showed bacteria that usually causes acne. As long as she is doing well, no additional antibiotic treatment needed. Please also check how she is doing with the clobetasol prescribed last week. Thank you!  MAs please call. Thank you!

## 2022-06-01 ENCOUNTER — Ambulatory Visit
Admission: RE | Admit: 2022-06-01 | Discharge: 2022-06-01 | Disposition: A | Discharge status: Home / Self Care | Payer: BC Managed Care – PPO | Source: Ambulatory Visit | Attending: Family Medicine | Admitting: Family Medicine

## 2022-06-01 ENCOUNTER — Encounter: Payer: Self-pay | Admitting: Radiology

## 2022-06-01 DIAGNOSIS — Z1231 Encounter for screening mammogram for malignant neoplasm of breast: Secondary | ICD-10-CM

## 2022-06-04 ENCOUNTER — Inpatient Hospital Stay
Admission: RE | Admit: 2022-06-04 | Discharge: 2022-06-04 | Disposition: A | Discharge status: Home / Self Care | Payer: Self-pay | Source: Ambulatory Visit | Attending: *Deleted | Admitting: *Deleted

## 2022-06-04 ENCOUNTER — Other Ambulatory Visit: Payer: Self-pay | Admitting: *Deleted

## 2022-06-04 DIAGNOSIS — Z1231 Encounter for screening mammogram for malignant neoplasm of breast: Secondary | ICD-10-CM

## 2022-07-12 ENCOUNTER — Telehealth: Payer: Self-pay

## 2022-07-12 DIAGNOSIS — Z1211 Encounter for screening for malignant neoplasm of colon: Secondary | ICD-10-CM

## 2022-07-12 NOTE — Telephone Encounter (Signed)
Gastroenterology Pre-Procedure Review Patient has requested a call back next week to schedule date.  Request Date: TBD Requesting Physician: Dr. Dellie Catholic  PATIENT REVIEW QUESTIONS: The patient responded to the following health history questions as indicated:    1. Are you having any GI issues? no 2. Do you have a personal history of Polyps? no 3. Do you have a family history of Colon Cancer or Polyps? no 4. Diabetes Mellitus? no 5. Joint replacements in the past 12 months?no 6. Major health problems in the past 3 months?no 7. Any artificial heart valves, MVP, or defibrillator?no    MEDICATIONS & ALLERGIES:    Patient reports the following regarding taking any anticoagulation/antiplatelet therapy:   Plavix, Coumadin, Eliquis, Xarelto, Lovenox, Pradaxa, Brilinta, or Effient? no Aspirin? no  Patient confirms/reports the following medications:  Current Outpatient Medications  Medication Sig Dispense Refill   clobetasol ointment (TEMOVATE) 0.05 % Apply bid to aa of rash for up to 2 weeks. Do not apply to open areas. Avoid applying to face, groin, and axilla. Use as directed. Long-term use can cause thinning of the skin. 60 g 1   levocetirizine (XYZAL) 5 MG tablet Take 1 tablet (5 mg total) by mouth every evening. 30 tablet 5   mupirocin ointment (BACTROBAN) 2 % Apply 1 Application topically daily. And cover with band aid 22 g 0   No current facility-administered medications for this visit.    Patient confirms/reports the following allergies:  Allergies  Allergen Reactions   Sulfa Antibiotics     No orders of the defined types were placed in this encounter.   AUTHORIZATION INFORMATION Primary Insurance: 1D#: Group #:  Secondary Insurance: 1D#: Group #:  SCHEDULE INFORMATION: Date: TBD Time: Location: TBD

## 2022-07-19 ENCOUNTER — Encounter: Payer: Self-pay | Admitting: Family Medicine

## 2022-08-02 ENCOUNTER — Ambulatory Visit: Payer: PRIVATE HEALTH INSURANCE | Admitting: Dermatology

## 2022-08-19 ENCOUNTER — Emergency Department
Admission: EM | Admit: 2022-08-19 | Discharge: 2022-08-19 | Disposition: A | Discharge status: Home / Self Care | Payer: BC Managed Care – PPO | Attending: Emergency Medicine | Admitting: Emergency Medicine

## 2022-08-19 ENCOUNTER — Other Ambulatory Visit: Payer: Self-pay

## 2022-08-19 DIAGNOSIS — L03114 Cellulitis of left upper limb: Secondary | ICD-10-CM | POA: Diagnosis not present

## 2022-08-19 DIAGNOSIS — M7989 Other specified soft tissue disorders: Secondary | ICD-10-CM | POA: Diagnosis present

## 2022-08-19 LAB — CBC WITH DIFFERENTIAL/PLATELET
Abs Immature Granulocytes: 0.09 10*3/uL — ABNORMAL HIGH (ref 0.00–0.07)
Basophils Absolute: 0 10*3/uL (ref 0.0–0.1)
Basophils Relative: 0 %
Eosinophils Absolute: 0.5 10*3/uL (ref 0.0–0.5)
Eosinophils Relative: 5 %
HCT: 46.3 % — ABNORMAL HIGH (ref 36.0–46.0)
Hemoglobin: 15.3 g/dL — ABNORMAL HIGH (ref 12.0–15.0)
Immature Granulocytes: 1 %
Lymphocytes Relative: 24 %
Lymphs Abs: 2.6 10*3/uL (ref 0.7–4.0)
MCH: 30.4 pg (ref 26.0–34.0)
MCHC: 33 g/dL (ref 30.0–36.0)
MCV: 91.9 fL (ref 80.0–100.0)
Monocytes Absolute: 0.6 10*3/uL (ref 0.1–1.0)
Monocytes Relative: 5 %
Neutro Abs: 7 10*3/uL (ref 1.7–7.7)
Neutrophils Relative %: 65 %
Platelets: 283 10*3/uL (ref 150–400)
RBC: 5.04 MIL/uL (ref 3.87–5.11)
RDW: 12.9 % (ref 11.5–15.5)
WBC: 10.8 10*3/uL — ABNORMAL HIGH (ref 4.0–10.5)
nRBC: 0 % (ref 0.0–0.2)

## 2022-08-19 LAB — BASIC METABOLIC PANEL
Anion gap: 8 (ref 5–15)
BUN: 14 mg/dL (ref 6–20)
CO2: 26 mmol/L (ref 22–32)
Calcium: 9.2 mg/dL (ref 8.9–10.3)
Chloride: 101 mmol/L (ref 98–111)
Creatinine, Ser: 0.74 mg/dL (ref 0.44–1.00)
GFR, Estimated: >60 mL/min (ref >60)
Glucose, Bld: 109 mg/dL — ABNORMAL HIGH (ref 70–99)
Potassium: 3.9 mmol/L (ref 3.5–5.1)
Sodium: 135 mmol/L (ref 135–145)

## 2022-08-19 MED ORDER — DOXYCYCLINE MONOHYDRATE 100 MG PO TABS: 100.0000 mg | ORAL_TABLET | Freq: Two times a day (BID) | ORAL | 0 refills | Status: AC

## 2022-08-19 MED ORDER — SODIUM CHLORIDE 0.9 % IV SOLN
2.0000 g | INTRAVENOUS | Status: DC
  Administered 2022-08-19: 2 g via INTRAVENOUS
  Filled 2022-08-19: qty 20

## 2022-08-19 MED ORDER — CEPHALEXIN 500 MG PO CAPS: 500.0000 mg | ORAL_CAPSULE | Freq: Four times a day (QID) | ORAL | 0 refills | Status: AC

## 2022-08-19 NOTE — Discharge Instructions (Signed)
Keep your arm elevated.  Please follow-up with your outpatient provider for wound recheck in 2 days.  Continue the antibiotics as prescribed.  Please return for any new, worsening, or change in symptoms or other concerns.  It was a pleasure caring for you today.

## 2022-08-19 NOTE — ED Notes (Signed)
Pt unsure what may have bit her. Has 2 possible bite marks to L hand. Whole L hand is swollen and has red streaking going up L arm to elbow area. She first noticed the streaking last night. Another new area of streaking has been present since this morning. Denies significant pain.

## 2022-08-19 NOTE — ED Triage Notes (Signed)
Pt reports yesterday started with itching to her left arm. Pt reports started with 2 small bumps on her hand, unsure but thinks she may have been bit by something. Pt reports itching became worse and now has redness and itching up her left arm.

## 2022-08-19 NOTE — ED Provider Notes (Signed)
[facility] Provider Note    Event Date/Time   First MD Initiated Contact with Patient 08/19/22 [phone]     (approximate)   History   Rash   HPI  Amy Arroyo is a 52 y.o. female who presents today for evaluation of left arm erythema.  Patient reports that yesterday she was bit by an insect on 2 places on the dorsum of her hand between her thumb and index finger, and she was scratching it quite a bit.  Today she noticed redness extending from these bite areas with a red streak to her elbow.  No fevers or chills.  No pain.  She is able to move all of her fingers and wrist normally.  She is unsure what bit her.  There are no problems to display for this patient.         Physical Exam   Triage Vital Signs: ED Triage Vitals  Enc Vitals Group     BP 08/19/22 0924 (!) 159/105     Pulse Rate 08/19/22 0924 84     Resp 08/19/22 0924 20     Temp 08/19/22 0924 98.4 F (36.9 C)     Temp Source 08/19/22 0924 Oral     SpO2 08/19/22 0924 100 %     Weight 08/19/22 0923 200 lb (90.7 kg)     Height 08/19/22 0923 '5\' 2"'$  (1.575 m)     Head Circumference --      Peak Flow --      Pain Score 08/19/22 0923 3     Pain Loc --      Pain Edu? --      Excl. in [place]? --     Most recent vital signs: Vitals:   08/19/22 0924 08/19/22 1116  BP: (!) 159/105 (!) 158/103  Pulse: 84 84  Resp: 20 16  Temp: 98.4 F (36.9 C) 98.6 F (37 C)  SpO2: 100% 100%    Physical Exam Vitals and nursing note reviewed.  Constitutional:      General: Awake and alert. No acute distress.    Appearance: Normal appearance. The patient is normal weight.  HENT:     Head: Normocephalic and atraumatic.     Mouth: Mucous membranes are moist.  Eyes:     General: PERRL. Normal EOMs        Right eye: No discharge.        Left eye: No discharge.     Conjunctiva/sclera: Conjunctivae normal.  Cardiovascular:     Rate and Rhythm: Normal rate and regular rhythm.     Pulses: Normal  pulses.  Pulmonary:     Effort: Pulmonary effort is normal. No respiratory distress.     Breath sounds: Normal breath sounds.  Abdominal:     Abdomen is soft. There is no abdominal tenderness. No rebound or guarding. No distention. Musculoskeletal:        General: No swelling. Normal range of motion.     Cervical back: Normal range of motion and neck supple.  Left upper extremity: Area of erythema and swelling to the webspace between thumb and index finger, with erythema extending proximally to the wrist.  Lymphangitis noted to the [facility] fossa.  She is able to abduct and adduct thumb fully.  She has no tenderness to palpation.  Normal grip strength.  Able to range wrist normally.  Compartments are soft compressible throughout.  No palmar abnormalities.  No pain with passive extension of fingers or wrist Skin:  General: Skin is warm and dry.     Capillary Refill: Capillary refill takes less than 2 seconds.     Findings: No rash.  Neurological:     Mental Status: The patient is awake and alert.      ED Results / Procedures / Treatments   Labs (all labs ordered are listed, but only abnormal results are displayed) Labs Reviewed  BASIC METABOLIC PANEL - Abnormal; Notable for the following components:      Result Value   Glucose, Bld 109 (*)    All other components within normal limits  CBC WITH DIFFERENTIAL/PLATELET - Abnormal; Notable for the following components:   WBC 10.8 (*)    Hemoglobin 15.3 (*)    HCT 46.3 (*)    Abs Immature Granulocytes 0.09 (*)    All other components within normal limits     EKG     RADIOLOGY     PROCEDURES:  Critical Care performed:   Procedures   MEDICATIONS ORDERED IN ED: Medications  cefTRIAXone (ROCEPHIN) 2 g in sodium chloride 0.9 % 100 mL IVPB (0 g Intravenous Stopped 08/19/22 1030)     IMPRESSION / MDM / ASSESSMENT AND PLAN / ED COURSE  I reviewed the triage vital signs and the nursing notes.   Differential diagnosis  includes, but is not limited to, cellulitis, abscess, allergic reaction, lymphangitis.  Patient is awake and alert, hemodynamically stable and afebrile.  She has obvious area of erythema and swelling to the dorsum of her hand with lymphangitis to her AC fossa.  Symptoms most consistent with cellulitis, especially in the setting of recent bite and scratching the area.  She has no pain to palpation.  She has no fevers or chills to suggest SIRS/sepsis.  Her compartments are soft compressible throughout with normal distal pulses and sensation, no pain out of proportion, no pain with passive extension of fingers or wrist, not consistent with compartment syndrome.  No focal area of fluctuance to suggest abscess.  She has full normal range of motion of all digits in her hand and wrist, no pain with axial loading, no joint effusion, do not suspect septic wrist.  No tenderness along the flexor tendon sheath to suggest flexor tenosynovitis.  Physical exam is consistent with cellulitis. No crepitus or bullae or hemodynamic instability or pain out of proportion to indicate deep space infection. No fluctuance to suggest cystic lesion such as abscess. No fevers or constitutional symptoms to suggest systemic infection.  Physical exam does not show any evidence of joint involvement. Labs are overall reassuring. Patient will initiate oral antibiotics and follow-up closely as an outpatient. Return if worsening or developing new symptoms in which case may require IV antibiotics. The area was outlined with skin marking pen. Discussed care plan, return precautions, and advised close outpatient follow-up. Patient agrees with plan of care.  Patient was discussed with Dr. Jacelyn Grip who agrees with assessment and plan.  Patient's presentation is most consistent with acute complicated illness / injury requiring diagnostic workup.    FINAL CLINICAL IMPRESSION(S) / ED DIAGNOSES   Final diagnoses:  Cellulitis of left upper extremity      Rx / DC Orders   ED Discharge Orders          Ordered    cephALEXin (KEFLEX) 500 MG capsule  4 times daily        08/19/22 1113    doxycycline (ADOXA) 100 MG tablet  2 times daily        08/19/22 1113  Note:  This document was prepared using Dragon voice recognition software and may include unintentional dictation errors.   Emeline Gins 08/19/22 1350    Lucillie Garfinkel, MD 08/19/22 [phone]

## 2023-04-08 ENCOUNTER — Other Ambulatory Visit: Payer: Self-pay | Admitting: Family Medicine

## 2023-04-08 DIAGNOSIS — R2241 Localized swelling, mass and lump, right lower limb: Secondary | ICD-10-CM

## 2023-04-09 ENCOUNTER — Ambulatory Visit
Admission: RE | Admit: 2023-04-09 | Discharge: 2023-04-09 | Disposition: A | Discharge status: Home / Self Care | Payer: BC Managed Care – PPO | Source: Ambulatory Visit | Attending: Family Medicine | Admitting: Family Medicine

## 2023-04-09 ENCOUNTER — Other Ambulatory Visit: Payer: Self-pay | Admitting: Family Medicine

## 2023-04-09 ENCOUNTER — Ambulatory Visit
Admission: RE | Admit: 2023-04-09 | Discharge: 2023-04-09 | Disposition: A | Discharge status: Home / Self Care | Payer: BC Managed Care – PPO | Source: Ambulatory Visit | Attending: Family Medicine | Admitting: *Deleted

## 2023-04-09 DIAGNOSIS — M25571 Pain in right ankle and joints of right foot: Secondary | ICD-10-CM | POA: Diagnosis present

## 2023-04-09 DIAGNOSIS — R2241 Localized swelling, mass and lump, right lower limb: Secondary | ICD-10-CM | POA: Diagnosis present

## 2023-04-09 DIAGNOSIS — M25561 Pain in right knee: Secondary | ICD-10-CM | POA: Diagnosis present

## 2023-04-10 ENCOUNTER — Other Ambulatory Visit: Payer: Self-pay

## 2023-04-10 DIAGNOSIS — Z1211 Encounter for screening for malignant neoplasm of colon: Secondary | ICD-10-CM

## 2023-04-16 ENCOUNTER — Other Ambulatory Visit: Payer: Self-pay

## 2023-04-16 MED ORDER — NA SULFATE-K SULFATE-MG SULF 17.5-3.13-1.6 GM/177ML PO SOLN: 354.0000 mL | Freq: Once | ORAL | 0 refills | Status: AC

## 2023-04-17 ENCOUNTER — Encounter: Payer: Self-pay | Admitting: Gastroenterology

## 2023-04-24 ENCOUNTER — Encounter
Admission: RE | Disposition: A | Discharge status: Home / Self Care | Payer: Self-pay | Source: Home / Self Care | Attending: Gastroenterology

## 2023-04-24 ENCOUNTER — Ambulatory Visit: Payer: BC Managed Care – PPO | Admitting: Anesthesiology

## 2023-04-24 ENCOUNTER — Ambulatory Visit
Admission: RE | Admit: 2023-04-24 | Discharge: 2023-04-24 | Disposition: A | Discharge status: Home / Self Care | Payer: BC Managed Care – PPO | Attending: Gastroenterology | Admitting: Gastroenterology

## 2023-04-24 DIAGNOSIS — Z1211 Encounter for screening for malignant neoplasm of colon: Secondary | ICD-10-CM | POA: Diagnosis present

## 2023-04-24 DIAGNOSIS — Z800 Family history of malignant neoplasm of digestive organs: Secondary | ICD-10-CM | POA: Diagnosis not present

## 2023-04-24 DIAGNOSIS — D126 Benign neoplasm of colon, unspecified: Secondary | ICD-10-CM

## 2023-04-24 DIAGNOSIS — D125 Benign neoplasm of sigmoid colon: Secondary | ICD-10-CM | POA: Diagnosis not present

## 2023-04-24 DIAGNOSIS — D120 Benign neoplasm of cecum: Secondary | ICD-10-CM | POA: Insufficient documentation

## 2023-04-24 HISTORY — DX: Prediabetes: R73.03

## 2023-04-24 HISTORY — PX: HOT HEMOSTASIS: SHX5433

## 2023-04-24 HISTORY — PX: HEMOSTASIS CLIP PLACEMENT: SHX6857

## 2023-04-24 HISTORY — PX: COLONOSCOPY WITH PROPOFOL: SHX5780

## 2023-04-24 HISTORY — PX: POLYPECTOMY: SHX5525

## 2023-04-24 LAB — POCT PREGNANCY, URINE: Preg Test, Ur: NEGATIVE

## 2023-04-24 SURGERY — COLONOSCOPY WITH PROPOFOL
Anesthesia: General

## 2023-04-24 MED ORDER — PROPOFOL 500 MG/50ML IV EMUL
INTRAVENOUS | Status: DC | PRN
  Administered 2023-04-24: 150 ug/kg/min via INTRAVENOUS

## 2023-04-24 MED ORDER — LIDOCAINE HCL (CARDIAC) PF 100 MG/5ML IV SOSY
PREFILLED_SYRINGE | INTRAVENOUS | Status: DC | PRN
  Administered 2023-04-24: 100 mg via INTRAVENOUS

## 2023-04-24 MED ORDER — PROPOFOL 10 MG/ML IV BOLUS
INTRAVENOUS | Status: DC | PRN
  Administered 2023-04-24: 80 mg via INTRAVENOUS

## 2023-04-24 MED ORDER — SODIUM CHLORIDE 0.9 % IV SOLN: INTRAVENOUS | Status: DC

## 2023-04-24 NOTE — H&P (Signed)
     Wyline Mood, MD [address], [address], [place], [place], [address] [address], [address], [place], [place], [address] Phone: [phone]  Fax: [phone]  Primary Care Physician:  Hillery Aldo, MD   Pre-Procedure History & Physical: HPI:  Amy Arroyo is a 52 y.o. female is here for an colonoscopy.   Past Medical History:  Diagnosis Date   Pre-diabetes     Past Surgical History:  Procedure Laterality Date   HAND SURGERY Left    left thumb    Prior to Admission medications   Medication Sig Start Date End Date Taking? Authorizing Provider  clobetasol ointment (TEMOVATE) 0.05 % Apply bid to aa of rash for up to 2 weeks. Do not apply to open areas. Avoid applying to face, groin, and axilla. Use as directed. Long-term use can cause thinning of the skin. Patient not taking: Reported on 07/12/2022 05/23/22   Neale Burly, [place], MD  levocetirizine (XYZAL) 5 MG tablet Take 1 tablet (5 mg total) by mouth every evening. 05/16/22   Willeen Niece, MD  mupirocin ointment (BACTROBAN) 2 % Apply 1 Application topically daily. And cover with band aid Patient not taking: Reported on 07/12/2022 05/17/22   Sandi Mealy, MD    Allergies as of 04/10/2023 - Review Complete 08/19/2022  Allergen Reaction Noted   Sulfa antibiotics  05/16/2022    History reviewed. No pertinent family history.  Social History   Socioeconomic History   Marital status: Married    Spouse name: Not on file   Number of children: Not on file   Years of education: Not on file   Highest education level: Not on file  Occupational History   Not on file  Tobacco Use   Smoking status: Never   Smokeless tobacco: Never  Vaping Use   Vaping status: Never Used  Substance and Sexual Activity   Alcohol use: Never   Drug use: Never   Sexual activity: Not on file  Other Topics Concern   Not on file  Social History Narrative   Not on file   Social Determinants of Health   Financial Resource Strain:  Not on file  Food Insecurity: Not on file  Transportation Needs: Not on file  Physical Activity: Not on file  Stress: Not on file  Social Connections: Not on file  Intimate Partner Violence: Not on file    Review of Systems: See HPI, otherwise negative ROS  Physical Exam: There were no vitals taken for this visit. General:   Alert,  pleasant and cooperative in NAD Head:  Normocephalic and atraumatic. Neck:  Supple; no masses or thyromegaly. Lungs:  Clear throughout to auscultation, normal respiratory effort.    Heart:  +S1, +S2, Regular rate and rhythm, No edema. Abdomen:  Soft, nontender and nondistended. Normal bowel sounds, without guarding, and without rebound.   Neurologic:  Alert and  oriented x4;  grossly normal neurologically.  Impression/Plan: Amy Arroyo is here for an colonoscopy to be performed for Screening colonoscopy average risk   Risks, benefits, limitations, and alternatives regarding  colonoscopy have been reviewed with the patient.  Questions have been answered.  All parties agreeable.   Wyline Mood, MD  04/24/2023, 9:26 AM

## 2023-04-24 NOTE — Op Note (Signed)
[facility] Gastroenterology Patient Name: Amy Arroyo Procedure Date: 04/24/2023 10:38 AM MRN: [id number] Account #: [id number] Date of Birth: [birth year] Admit Type: Outpatient Age: 52 Room: [facility] ENDO ROOM 3 Gender: Female Note Status: Finalized Instrument Name: Prentice Docker [id number] Procedure:             Colonoscopy Indications:           Screening in patient at increased risk: Colorectal                         cancer in brother before age 45 Providers:             Wyline Mood MD, MD Referring MD:          Hillery Aldo MD, MD (Referring MD) Medicines:             Monitored Anesthesia Care Complications:         No immediate complications. Procedure:             Pre-Anesthesia Assessment:                        - Prior to the procedure, a History and Physical was                         performed, and patient medications, allergies and                         sensitivities were reviewed. The patient's tolerance                         of previous anesthesia was reviewed.                        - The risks and benefits of the procedure and the                         sedation options and risks were discussed with the                         patient. All questions were answered and informed                         consent was obtained.                        - ASA Grade Assessment: II - A patient with mild                         systemic disease.                        After obtaining informed consent, the colonoscope was                         passed under direct vision. Throughout the procedure,                         the patient's blood pressure, pulse, and oxygen  saturations were monitored continuously. The                         Colonoscope was introduced through the anus and                         advanced to the the cecum, identified by the                         appendiceal orifice. The colonoscopy was performed                          with ease. The patient tolerated the procedure well.                         The quality of the bowel preparation was excellent.                         The ileocecal valve, appendiceal orifice, and rectum                         were photographed. Findings:      The perianal and digital rectal examinations were normal.      A 7 mm polyp was found in the cecum. The polyp was sessile. The polyp       was removed with a cold snare. Resection and retrieval were complete.      A 15 mm polyp was found in the sigmoid colon. The polyp was       pedunculated. The polyp was removed with a hot snare. Resection and       retrieval were complete. To prevent bleeding post-intervention, one       hemostatic clip was successfully placed. Clip manufacturer: [employer]. There was no bleeding at the end of the procedure.      The exam was otherwise without abnormality on direct and retroflexion       views. Impression:            - One 7 mm polyp in the cecum, removed with a cold                         snare. Resected and retrieved.                        - One 15 mm polyp in the sigmoid colon, removed with a                         hot snare. Resected and retrieved. Clip was placed.                         Clip manufacturer: [employer].                        - The examination was otherwise normal on direct and                         retroflexion views. Recommendation:        - Discharge patient to home (with escort).                        -  Resume previous diet.                        - Continue present medications.                        - Await pathology results.                        - Repeat colonoscopy for surveillance based on                         pathology results. Procedure Code(s):     --- Professional ---                        [id number], Colonoscopy, flexible; with removal of                         tumor(s), polyp(s), or other lesion(s) by snare                          technique Diagnosis Code(s):     --- Professional ---                        Z80.0, Family history of malignant neoplasm of                         digestive organs                        D12.0, Benign neoplasm of cecum                        D12.5, Benign neoplasm of sigmoid colon CPT copyright 2022 American Medical Association. All rights reserved. The codes documented in this report are preliminary and upon coder review may  be revised to meet current compliance requirements. Wyline Mood, MD Wyline Mood MD, MD 04/24/2023 11:22:57 AM This report has been signed electronically. Number of Addenda: 0 Note Initiated On: 04/24/2023 10:38 AM Scope Withdrawal Time: 0 hours 10 minutes 53 seconds  Total Procedure Duration: 0 hours 13 minutes 13 seconds  Estimated Blood Loss:  Estimated blood loss: none.      [facility]

## 2023-04-24 NOTE — Anesthesia Postprocedure Evaluation (Signed)
Anesthesia Post Note  Patient: Amy Arroyo  Procedure(s) Performed: COLONOSCOPY WITH PROPOFOL POLYPECTOMY HOT HEMOSTASIS (ARGON PLASMA COAGULATION/BICAP) HEMOSTASIS CLIP PLACEMENT  Patient location during evaluation: PACU Anesthesia Type: General Level of consciousness: awake and alert, oriented and patient cooperative Pain management: pain level controlled Vital Signs Assessment: post-procedure vital signs reviewed and stable Respiratory status: spontaneous breathing, nonlabored ventilation and respiratory function stable Cardiovascular status: blood pressure returned to baseline and stable Postop Assessment: adequate PO intake Anesthetic complications: no   No notable events documented.   Last Vitals:  Vitals:   04/24/23 1131 04/24/23 1141  BP: 106/68 118/74  Pulse: 66   Resp: 15 19  Temp:    SpO2: 99% 100%    Last Pain:  Vitals:   04/24/23 1141  TempSrc:   PainSc: 0-No pain                 Reed Breech

## 2023-04-24 NOTE — Anesthesia Preprocedure Evaluation (Addendum)
Anesthesia Evaluation  Patient identified by MRN, date of birth, ID band Patient awake    Reviewed: Allergy & Precautions, NPO status , Patient's Chart, lab work & pertinent test results  History of Anesthesia Complications Negative for: history of anesthetic complications  Airway Mallampati: I   Neck ROM: Full    Dental  (+) Missing   Pulmonary neg pulmonary ROS   Pulmonary exam normal breath sounds clear to auscultation       Cardiovascular Exercise Tolerance: Good negative cardio ROS Normal cardiovascular exam Rhythm:Regular Rate:Normal     Neuro/Psych negative neurological ROS     GI/Hepatic negative GI ROS,,,  Endo/Other  Prediabetes   Renal/GU negative Renal ROS     Musculoskeletal   Abdominal   Peds  Hematology negative hematology ROS (+)   Anesthesia Other Findings   Reproductive/Obstetrics                             Anesthesia Physical Anesthesia Plan  ASA: 2  Anesthesia Plan: General   Post-op Pain Management:    Induction: Intravenous  PONV Risk Score and Plan: 3 and Propofol infusion, TIVA and Treatment may vary due to age or medical condition  Airway Management Planned: Natural Airway  Additional Equipment:   Intra-op Plan:   Post-operative Plan:   Informed Consent: I have reviewed the patients History and Physical, chart, labs and discussed the procedure including the risks, benefits and alternatives for the proposed anesthesia with the patient or authorized representative who has indicated his/her understanding and acceptance.       Plan Discussed with: CRNA  Anesthesia Plan Comments: (LMA/GETA backup discussed.  Patient consented for risks of anesthesia including but not limited to:  - adverse reactions to medications - damage to eyes, teeth, lips or other oral mucosa - nerve damage due to positioning  - sore throat or hoarseness - damage to  heart, brain, nerves, lungs, other parts of body or loss of life  Informed patient about role of CRNA in peri- and intra-operative care.  Patient voiced understanding.)       Anesthesia Quick Evaluation

## 2023-04-24 NOTE — Transfer of Care (Signed)
Immediate Anesthesia Transfer of Care Note  Patient: Amy Arroyo  Procedure(s) Performed: COLONOSCOPY WITH PROPOFOL POLYPECTOMY  Patient Location: Endoscopy Unit  Anesthesia Type:General  Level of Consciousness: sedated  Airway & Oxygen Therapy: Patient Spontanous Breathing  Post-op Assessment: Report given to RN  Post vital signs: stable  Last Vitals:  Vitals Value Taken Time  BP 94/58 04/24/23 1121  Temp 35.6 C 04/24/23 1121  Pulse 71 04/24/23 1121  Resp 18 04/24/23 1121  SpO2 97 % 04/24/23 1121    Last Pain:  Vitals:   04/24/23 1121  TempSrc: Temporal  PainSc: Asleep         Complications: No notable events documented.

## 2023-04-25 ENCOUNTER — Encounter: Payer: Self-pay | Admitting: Gastroenterology

## 2023-04-25 LAB — SURGICAL PATHOLOGY

## 2023-05-09 ENCOUNTER — Ambulatory Visit: Payer: BC Managed Care – PPO | Admitting: Dermatology

## 2023-05-09 ENCOUNTER — Encounter: Payer: Self-pay | Admitting: Dermatology

## 2023-05-09 DIAGNOSIS — Z7189 Other specified counseling: Secondary | ICD-10-CM

## 2023-05-09 DIAGNOSIS — L719 Rosacea, unspecified: Secondary | ICD-10-CM | POA: Diagnosis not present

## 2023-05-09 MED ORDER — IVERMECTIN 1 % EX CREA
TOPICAL_CREAM | CUTANEOUS | 2 refills | Status: AC
Start: 2023-05-09 — End: ?

## 2023-05-09 NOTE — Progress Notes (Signed)
   Follow Up Visit   Subjective  Amy Arroyo is a 52 y.o. female who presents for the following: Rash on face. Dur: 5 months. Itches. Still breaks out on neck and upper chest at times.  Was seen at allergist 03/14/23. Was told she had allergies to multiple things. Prescribed Tacrolimus 0.03% ointment, Zyzal, Famotidine, Montelukast, hydroxyzine 25 mg. Uses Mometasone cream for neck and chest when flares.   The regimen from allergist was not working, per patient, so she went back to PCP and was prescribed metronidazole 1% gel. Not helping.    The following portions of the chart were reviewed this encounter and updated as appropriate: medications, allergies, medical history  Review of Systems:  No other skin or systemic complaints except as noted in HPI or Assessment and Plan.  Objective  Well appearing patient in no apparent distress; mood and affect are within normal limits.  A focused examination was performed of the following areas: Face, neck, upper chest  Relevant exam findings are noted in the Assessment and Plan.    Assessment & Plan   Rosacea  Counseling and coordination of care    ROSACEA Exam Mid face and chest erythema with telangiectasias, few inflammatory papules at cheeks.  Chronic and persistent condition with duration or expected duration over one year. Condition is symptomatic/ bothersome to patient. Not currently at goal.   Rosacea is a chronic progressive skin condition usually affecting the face of adults, causing redness and/or acne bumps. It is treatable but not curable. It sometimes affects the eyes (ocular rosacea) as well. It may respond to topical and/or systemic medication and can flare with stress, sun exposure, alcohol, exercise, topical steroids (including hydrocortisone/cortisone 10) and some foods.  Daily application of broad spectrum spf 30+ sunscreen to face is recommended to reduce flares.  Patient denies grittiness of the  eyes  Treatment Plan  Start OXYMETAZOLINE 0.06%/IVERMECTIN 1% CREAM once daily to face. Does not cure redness, just temporarily reduces it, may have rebound worsening Sun protection daily  Counseling for BBL / IPL / Laser and Coordination of Care Discussed the treatment option of Broad Band Light (BBL) /Intense Pulsed Light (IPL)/ Laser for skin discoloration, including brown spots and redness.  Typically we recommend at least 1-3 treatment sessions about 5-8 weeks apart for best results.  Cannot have tanned skin when BBL performed, and regular use of sunscreen/photoprotection is advised after the procedure to help maintain results. The patient's condition may also require "maintenance treatments" in the future.  The fee for BBL / laser treatments is $350 per treatment session for the whole face.  A fee can be quoted for other parts of the body.  Insurance typically does not pay for BBL/laser treatments and therefore the fee is an out-of-pocket cost. Recommend prophylactic valtrex treatment. Once scheduled for procedure, will send Rx in prior to patient's appointment.   Continue current allergy medications as directed by allergist.    Return in about 3 months (around 08/09/2023) for Rosacea Follow Up.  I, Lawson Radar, CMA, am acting as scribe for Elie Goody, MD.   Documentation: I have reviewed the above documentation for accuracy and completeness, and I agree with the above.  Elie Goody, MD

## 2023-05-09 NOTE — Patient Instructions (Addendum)
Continue allergy medications as directed by allergist.    Start OXYMETAZOLINE 0.06%/IVERMECTIN 1% CREAM every morning to face.   [facility]  [address] [place], [place] [address]  Phone: [phone]    Counseling for BBL / IPL / Laser and Coordination of Care Discussed the treatment option of Broad Band Light (BBL) Windell Moulding Pulsed Light (IPL)/ Laser for skin discoloration, including brown spots and redness.  Typically we recommend at least 1-3 treatment sessions about 5-8 weeks apart for best results.  Cannot have tanned skin when BBL performed, and regular use of sunscreen/photoprotection is advised after the procedure to help maintain results. The patient's condition may also require "maintenance treatments" in the future.  The fee for BBL / laser treatments is $350 per treatment session for the whole face.  A fee can be quoted for other parts of the body.  Insurance typically does not pay for BBL/laser treatments and therefore the fee is an out-of-pocket cost. Recommend prophylactic valtrex treatment. Once scheduled for procedure, will send Rx in prior to patient's appointment.     Due to recent changes in healthcare laws, you may see results of your pathology and/or laboratory studies on MyChart before the doctors have had a chance to review them. We understand that in some cases there may be results that are confusing or concerning to you. Please understand that not all results are received at the same time and often the doctors may need to interpret multiple results in order to provide you with the best plan of care or course of treatment. Therefore, we ask that you please give [place] 2 business days to thoroughly review all your results before contacting the office for clarification. Should we see a critical lab result, you will be contacted sooner.   If You Need Anything After Your Visit  If you have any questions or concerns for your doctor, please call our main line at  [phone] and press option 4 to reach your doctor's medical assistant. If no one answers, please leave a voicemail as directed and we will return your call as soon as possible. Messages left after 4 pm will be answered the following business day.   You may also send [place] a message via MyChart. We typically respond to MyChart messages within 1-2 business days.  For prescription refills, please ask your pharmacy to contact our office. Our fax number is [phone].  If you have an urgent issue when the clinic is closed that cannot wait until the next business day, you can page your doctor at the number below.    Please note that while we do our best to be available for urgent issues outside of office hours, we are not available 24/7.   If you have an urgent issue and are unable to reach [place], you may choose to seek medical care at your doctor's office, retail clinic, urgent care center, or emergency room.  If you have a medical emergency, please immediately call 911 or go to the emergency department.  Pager Numbers  - Dr. Gwen Pounds: [phone]  - Dr. Roseanne Reno: [phone]  - Dr. Katrinka Blazing: [phone]   In the event of inclement weather, please call our main line at [phone] for an update on the status of any delays or closures.  Dermatology Medication Tips: Please keep the boxes that topical medications come in in order to help keep track of the instructions about where and how to use these. Pharmacies typically print the medication instructions only on the boxes and not  directly on the medication tubes.   If your medication is too expensive, please contact our office at [phone] option 4 or send [place] a message through MyChart.   We are unable to tell what your co-pay for medications will be in advance as this is different depending on your insurance coverage. However, we may be able to find a substitute medication at lower cost or fill out paperwork to get insurance to cover a needed  medication.   If a prior authorization is required to get your medication covered by your insurance company, please allow [place] 1-2 business days to complete this process.  Drug prices often vary depending on where the prescription is filled and some pharmacies may offer cheaper prices.  The website www.goodrx.com contains coupons for medications through different pharmacies. The prices here do not account for what the cost may be with help from insurance (it may be cheaper with your insurance), but the website can give you the price if you did not use any insurance.  - You can print the associated coupon and take it with your prescription to the pharmacy.  - You may also stop by our office during regular business hours and pick up a GoodRx coupon card.  - If you need your prescription sent electronically to a different pharmacy, notify our office through [facility] or by phone at [phone] option 4.     Si Usted Necesita Algo Despus de Su Visita  Tambin puede enviarnos un mensaje a travs de Clinical cytogeneticist. Por lo general respondemos a los mensajes de MyChart en el transcurso de 1 a 2 das hbiles.  Para renovar recetas, por favor pida a su farmacia que se ponga en contacto con nuestra oficina. Annie Sable de fax es [place] [phone].  Si tiene un asunto urgente cuando la clnica est cerrada y que no puede esperar hasta el siguiente da hbil, puede llamar/localizar a su doctor(a) al nmero que aparece a continuacin.   Por favor, tenga en cuenta que aunque hacemos todo lo posible para estar disponibles para asuntos urgentes fuera del horario de [place], no estamos disponibles las 24 horas del da, los 7 [address]  [address] de la [place].   Si tiene un problema urgente y no puede comunicarse con nosotros, puede optar por buscar atencin mdica  en el consultorio de su doctor(a), en una clnica privada, en un centro de atencin urgente o en una sala de emergencias.  Si tiene Engineer, drilling,  por favor llame inmediatamente al 911 o vaya a la sala de emergencias.  Nmeros de bper  - Dr. Gwen Pounds: [phone]  - Dra. Roseanne Reno: [phone]  - Dr. Katrinka Blazing: [phone]   En caso de inclemencias del tiempo, por favor llame a Lacy Duverney principal al [phone] para una actualizacin sobre el [place] de cualquier retraso o cierre.  Consejos para la medicacin en dermatologa: Por favor, guarde las cajas en las que vienen los medicamentos de uso tpico para ayudarle a seguir las instrucciones sobre dnde y cmo usarlos. Las farmacias generalmente imprimen las instrucciones del medicamento slo en las cajas y no directamente en los tubos del [place].   Si su medicamento es muy caro, por favor, pngase en contacto con [place] llamando al [phone] y presione la opcin 4 o envenos un mensaje a travs de Clinical cytogeneticist.   No podemos decirle cul ser su copago por los medicamentos por adelantado ya que esto es diferente dependiendo de la cobertura de su seguro. Sin embargo, es posible que podamos encontrar un  medicamento sustituto a Audiological scientist un formulario para que el seguro cubra el medicamento que se considera necesario.   Si se requiere una autorizacin previa para que su compaa de seguros [place] su medicamento, por favor permtanos de 1 a 2 das hbiles para completar [address]  Los precios de los medicamentos varan con frecuencia dependiendo del Environmental consultant de dnde se surte la receta y alguna farmacias pueden ofrecer precios ms baratos.  El sitio web www.goodrx.com tiene cupones para medicamentos de Health and safety inspector. Los precios aqu no tienen en cuenta lo que podra costar con la ayuda del seguro (puede ser ms barato con su seguro), pero el sitio web puede darle el precio si no utiliz Tourist information centre manager.  - Puede imprimir el cupn correspondiente y llevarlo con su receta a la farmacia.  - Tambin puede pasar por nuestra oficina durante el horario de atencin  regular y Education officer, museum una tarjeta de cupones de GoodRx.  - Si necesita que su receta se enve electrnicamente a una farmacia diferente, informe a nuestra oficina a travs de MyChart de [place] o por telfono llamando al [phone] y presione la opcin 4.

## 2023-06-25 ENCOUNTER — Other Ambulatory Visit: Payer: Self-pay | Admitting: Family Medicine

## 2023-06-25 DIAGNOSIS — Z1231 Encounter for screening mammogram for malignant neoplasm of breast: Secondary | ICD-10-CM

## 2023-08-12 ENCOUNTER — Ambulatory Visit: Payer: BC Managed Care – PPO | Admitting: Dermatology

## 2023-12-12 ENCOUNTER — Encounter

## 2024-01-13 ENCOUNTER — Encounter

## 2024-05-27 ENCOUNTER — Other Ambulatory Visit: Payer: Self-pay | Admitting: Family Medicine

## 2024-05-27 DIAGNOSIS — Z1231 Encounter for screening mammogram for malignant neoplasm of breast: Secondary | ICD-10-CM

## 2024-07-03 ENCOUNTER — Ambulatory Visit
Admission: RE | Admit: 2024-07-03 | Discharge: 2024-07-03 | Disposition: A | Discharge status: Home / Self Care | Source: Ambulatory Visit | Attending: Family Medicine | Admitting: Family Medicine

## 2024-07-03 DIAGNOSIS — Z1231 Encounter for screening mammogram for malignant neoplasm of breast: Secondary | ICD-10-CM | POA: Insufficient documentation
